# Patient Record
Sex: Female | Born: 1995 | Race: Black or African American | Hispanic: No | Marital: Single | Smoking: Never smoker
Health system: Southern US, Community
[De-identification: ages and names within clinical notes are randomized; demographics above are authoritative.]

## PROBLEM LIST (undated history)

## (undated) DIAGNOSIS — J45909 Unspecified asthma, uncomplicated: Secondary | ICD-10-CM

---

## 2017-09-05 ENCOUNTER — Emergency Department (HOSPITAL_COMMUNITY)
Admission: EM | Admit: 2017-09-05 | Discharge: 2017-09-06 | Disposition: A | Discharge status: Home / Self Care | Payer: BLUE CROSS/BLUE SHIELD | Attending: Emergency Medicine | Admitting: Emergency Medicine

## 2017-09-05 ENCOUNTER — Other Ambulatory Visit: Payer: Self-pay

## 2017-09-05 ENCOUNTER — Encounter (HOSPITAL_COMMUNITY): Payer: Self-pay | Admitting: Emergency Medicine

## 2017-09-05 ENCOUNTER — Emergency Department (HOSPITAL_COMMUNITY): Payer: BLUE CROSS/BLUE SHIELD

## 2017-09-05 DIAGNOSIS — M25562 Pain in left knee: Secondary | ICD-10-CM | POA: Insufficient documentation

## 2017-09-05 DIAGNOSIS — Z5321 Procedure and treatment not carried out due to patient leaving prior to being seen by health care provider: Secondary | ICD-10-CM | POA: Insufficient documentation

## 2017-09-05 NOTE — ED Notes (Signed)
Pt called for triage x3 by Holley(RN) no response.

## 2017-09-05 NOTE — ED Triage Notes (Addendum)
Pt states yesterday she was lying down when her left knee cap popped out of place, she states it was on the side of her leg. EMS came out and put a knee sleeve on it and it popped back into place. Pt states she feels a knot to top of knee now with pain. Pedal pulses good. No discoloration to leg. *Pt also states she wants to get checked for PID while here, states she has implant brith control and she has frequent periods.

## 2017-09-05 NOTE — ED Notes (Signed)
When patient was called back Friends of patient had left.

## 2017-09-18 ENCOUNTER — Emergency Department (HOSPITAL_COMMUNITY)
Admission: EM | Admit: 2017-09-18 | Discharge: 2017-09-18 | Discharge status: Against Medical Advice | Payer: Medicaid Other

## 2017-09-18 ENCOUNTER — Other Ambulatory Visit: Payer: Self-pay

## 2017-09-18 NOTE — ED Notes (Signed)
No answer for triage.

## 2017-09-18 NOTE — ED Notes (Signed)
No answer when called 

## 2017-09-30 ENCOUNTER — Ambulatory Visit (INDEPENDENT_AMBULATORY_CARE_PROVIDER_SITE_OTHER): Payer: BLUE CROSS/BLUE SHIELD

## 2017-09-30 ENCOUNTER — Ambulatory Visit (HOSPITAL_COMMUNITY)
Admission: EM | Admit: 2017-09-30 | Discharge: 2017-09-30 | Disposition: A | Discharge status: Home / Self Care | Payer: BLUE CROSS/BLUE SHIELD | Attending: Family Medicine | Admitting: Family Medicine

## 2017-09-30 ENCOUNTER — Encounter (HOSPITAL_COMMUNITY): Payer: Self-pay | Admitting: Emergency Medicine

## 2017-09-30 DIAGNOSIS — M940 Chondrocostal junction syndrome [Tietze]: Secondary | ICD-10-CM

## 2017-09-30 DIAGNOSIS — S83002A Unspecified subluxation of left patella, initial encounter: Secondary | ICD-10-CM

## 2017-09-30 HISTORY — DX: Unspecified asthma, uncomplicated: J45.909

## 2017-09-30 MED ORDER — IBUPROFEN 800 MG PO TABS: 800.0000 mg | ORAL_TABLET | Freq: Three times a day (TID) | ORAL | 0 refills | Status: DC | PRN

## 2017-09-30 NOTE — Discharge Instructions (Addendum)
Wear knee brace Walking exercise to strengthen your quadricep muscle Take the ibuprofen 3 times a day with food This is for pain and inflammation This should help with the rib pain Work note is given Return if not better in several days
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
## 2017-09-30 NOTE — ED Triage Notes (Signed)
PT fell on left side last night and has left rib pain. PT reports left knee "popped out" 2 weeks ago and still has pain.

## 2018-01-08 ENCOUNTER — Other Ambulatory Visit: Payer: Self-pay

## 2018-01-08 ENCOUNTER — Encounter (HOSPITAL_COMMUNITY): Payer: Self-pay | Admitting: *Deleted

## 2018-01-08 ENCOUNTER — Ambulatory Visit (HOSPITAL_COMMUNITY)
Admission: EM | Admit: 2018-01-08 | Discharge: 2018-01-08 | Disposition: A | Discharge status: Home / Self Care | Payer: BLUE CROSS/BLUE SHIELD | Attending: Family Medicine | Admitting: Family Medicine

## 2018-01-08 DIAGNOSIS — M545 Low back pain, unspecified: Secondary | ICD-10-CM

## 2018-01-08 DIAGNOSIS — M62830 Muscle spasm of back: Secondary | ICD-10-CM

## 2018-01-08 DIAGNOSIS — M62838 Other muscle spasm: Secondary | ICD-10-CM

## 2018-01-08 MED ORDER — CYCLOBENZAPRINE HCL 10 MG PO TABS: 10.0000 mg | ORAL_TABLET | Freq: Two times a day (BID) | ORAL | 0 refills | Status: DC | PRN

## 2018-01-08 MED ORDER — NAPROXEN 375 MG PO TABS: 375.0000 mg | ORAL_TABLET | Freq: Two times a day (BID) | ORAL | 0 refills | Status: AC
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
## 2019-06-29 ENCOUNTER — Encounter (HOSPITAL_COMMUNITY): Payer: Self-pay

## 2019-06-29 ENCOUNTER — Other Ambulatory Visit: Payer: Self-pay

## 2019-06-29 ENCOUNTER — Ambulatory Visit (HOSPITAL_COMMUNITY)
Admission: EM | Admit: 2019-06-29 | Discharge: 2019-06-29 | Disposition: A | Discharge status: Home / Self Care | Payer: PRIVATE HEALTH INSURANCE | Attending: Family Medicine | Admitting: Family Medicine

## 2019-06-29 DIAGNOSIS — S300XXA Contusion of lower back and pelvis, initial encounter: Secondary | ICD-10-CM

## 2019-06-29 MED ORDER — TRAMADOL HCL 50 MG PO TABS: 50.0000 mg | ORAL_TABLET | Freq: Four times a day (QID) | ORAL | 0 refills | Status: AC | PRN

## 2019-06-29 NOTE — ED Triage Notes (Signed)
Patient presents to Urgent Care with complaints of lower back and butt pain since slipping on ice 5 days ago. Patient reports the pain has progressed, pt has been applying ice and taking nsaids, would like a note to remain out of work today.

## 2019-06-29 NOTE — ED Provider Notes (Signed)
[facility]   [id number] 06/29/19 Arrival Time: 2774  ASSESSMENT & PLAN:  1. Coccyx contusion, initial encounter     Able to ambulate here and hemodynamically stable. Continue Tylenol/Advil. Activities as tolerated. Expect slow improvement.  Meds ordered this encounter  Medications  . traMADol (ULTRAM) 50 MG tablet    Sig: Take 1 tablet (50 mg total) by mouth every 6 (six) hours as needed.    Dispense:  12 tablet    Refill:  0    Medication sedation precautions given. Encourage ROM/movement as tolerated.  Recommend: Follow-up Information    [facility].   Why: If worsening or failing to improve as anticipated. Contact information: [address] [place] [phone]          Reviewed expectations re: course of current medical issues. Questions answered. Outlined signs and symptoms indicating need for more acute intervention. Patient verbalized understanding. After Visit Summary given.   SUBJECTIVE: History from: patient.  Rachel Robles is a 24 y.o. female who presents with complaint of non-radiating persistent tailbone pain. Reports falling onto buttock approx 4 d ago. Immediate pain that has been continuing; dull ache over tailbone. History of back problems requiring medical care: none reported.  Aggravating factors: certain movements, prolonged walking/standing and prolonged sitting. Alleviating factors: have not been identified. Progressive LE weakness or saddle anesthesia: none. Extremity sensation changes or weakness: none. Ambulatory without difficulty. Normal bowel/bladder habits: yes; without urinary retention. Normal PO intake without n/v. No associated abdominal pain/n/v. Self treatment: has OTC analgesics with little relief.  Reports no chronic steroid use, fevers, IV drug use, or recent back surgeries or procedures.    OBJECTIVE:  Vitals:   06/29/19 1640  BP: 117/74    Pulse: 79  Resp: 15  Temp: 98.4 F (36.9 C)  TempSrc: Oral  SpO2: 99%    General appearance: alert; no distress HEENT: [place]; AT Neck: supple with FROM; without midline tenderness CV: RRR Lungs: unlabored respirations; symmetrical air entry Abdomen: soft, non-tender; non-distended Back: moderate and poorly localized tenderness to palpation over tailbone; FROM at waist; bruising: none; without lumbar midline tenderness Extremities: without edema; symmetrical without gross deformities; normal ROM of bilateral LE Skin: warm and dry Neurologic: normal gait but slow; no neurological abnormalities of LEs. Psychological: alert and cooperative; normal mood and affect    No Known Allergies  Past Medical History:  Diagnosis Date  . Asthma    Social History   Socioeconomic History  . Marital status: Single    Spouse name: Not on file  . Number of children: Not on file  . Years of education: Not on file  . Highest education level: Not on file  Occupational History  . Not on file  Tobacco Use  . Smoking status: Never Smoker  . Smokeless tobacco: Never Used  Substance and Sexual Activity  . Alcohol use: Yes    Comment: occ  . Drug use: Not on file  . Sexual activity: Not on file  Other Topics Concern  . Not on file  Social History Narrative  . Not on file   Social Determinants of Health   Financial Resource Strain:   . Difficulty of Paying Living Expenses: Not on file  Food Insecurity:   . Worried About Charity fundraiser in the Last Year: Not on file  . Ran Out of Food in the Last Year: Not on file  Transportation Needs:   . Lack of Transportation (  Medical): Not on file  . Lack of Transportation (Non-Medical): Not on file  Physical Activity:   . Days of Exercise per Week: Not on file  . Minutes of Exercise per Session: Not on file  Stress:   . Feeling of Stress : Not on file  Social Connections:   . Frequency of Communication with Friends and Family: Not on file   . Frequency of Social Gatherings with Friends and Family: Not on file  . Attends Religious Services: Not on file  . Active Member of Clubs or Organizations: Not on file  . Attends Banker Meetings: Not on file  . Marital Status: Not on file  Intimate Partner Violence:   . Fear of Current or Ex-Partner: Not on file  . Emotionally Abused: Not on file  . Physically Abused: Not on file  . Sexually Abused: Not on file   Family History  Problem Relation Age of Onset  . Healthy Mother   . Asthma Father    History reviewed. No pertinent surgical history.   Mardella Layman, MD 06/29/19 [phone]

## 2019-06-29 NOTE — Discharge Instructions (Addendum)
Be aware, you have been prescribed pain medications that may cause drowsiness. Do not combine with alcohol or other illicit drugs. Please do not drive, operate heavy machinery, or take part in activities that require making important decisions while on this medication as your judgement may be clouded.  

## 2019-06-30 ENCOUNTER — Telehealth (HOSPITAL_COMMUNITY): Payer: Self-pay

## 2020-03-28 IMAGING — CR DG KNEE COMPLETE 4+V*L*
4 series · 4 of 4 positions shown · non-contrast
Comparison: None.

CLINICAL DATA: Lateral left knee pain and popping.

EXAM:
LEFT KNEE - COMPLETE 4+ VIEW

[knee ap]
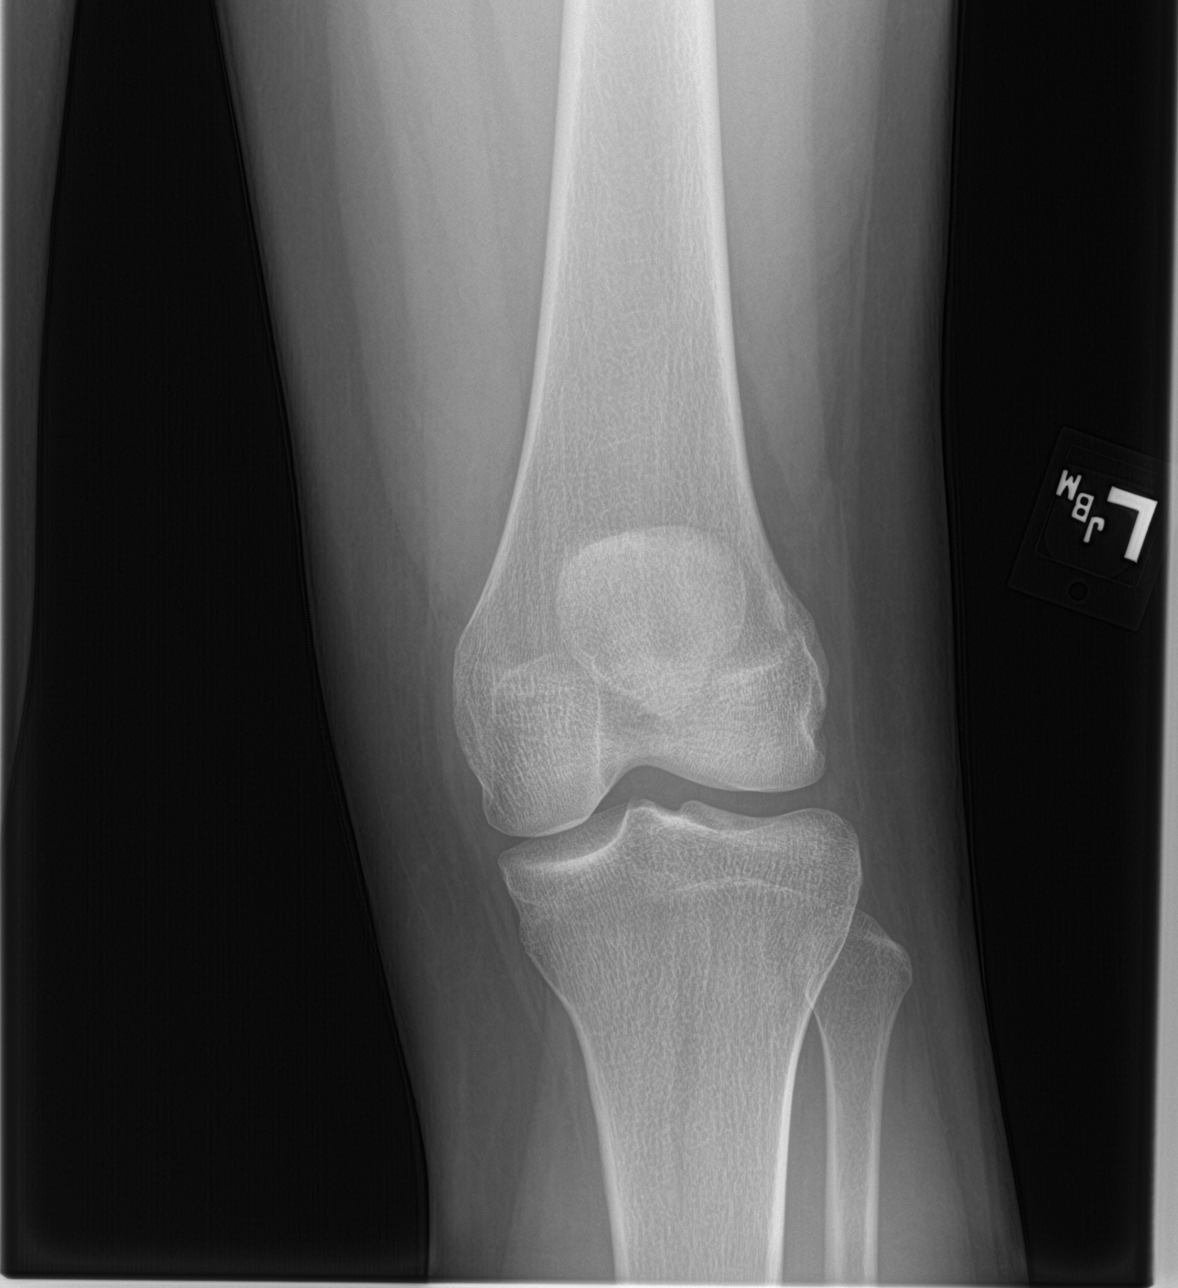

[knee lat]
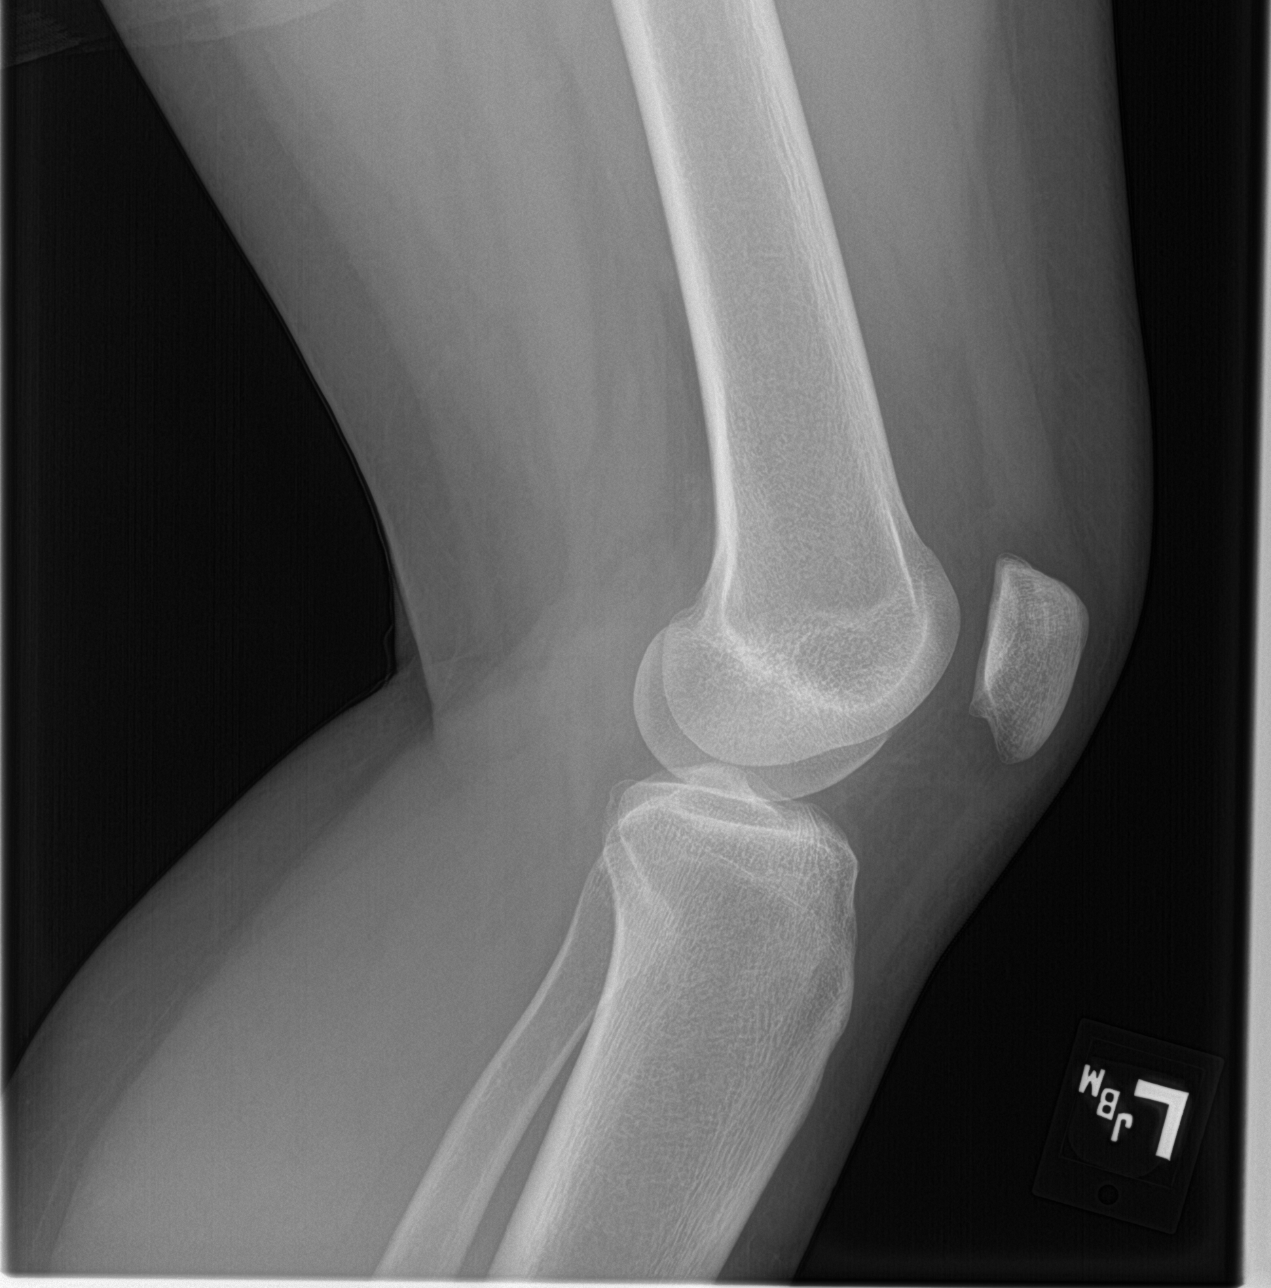

[knee obl (1 of 2)]
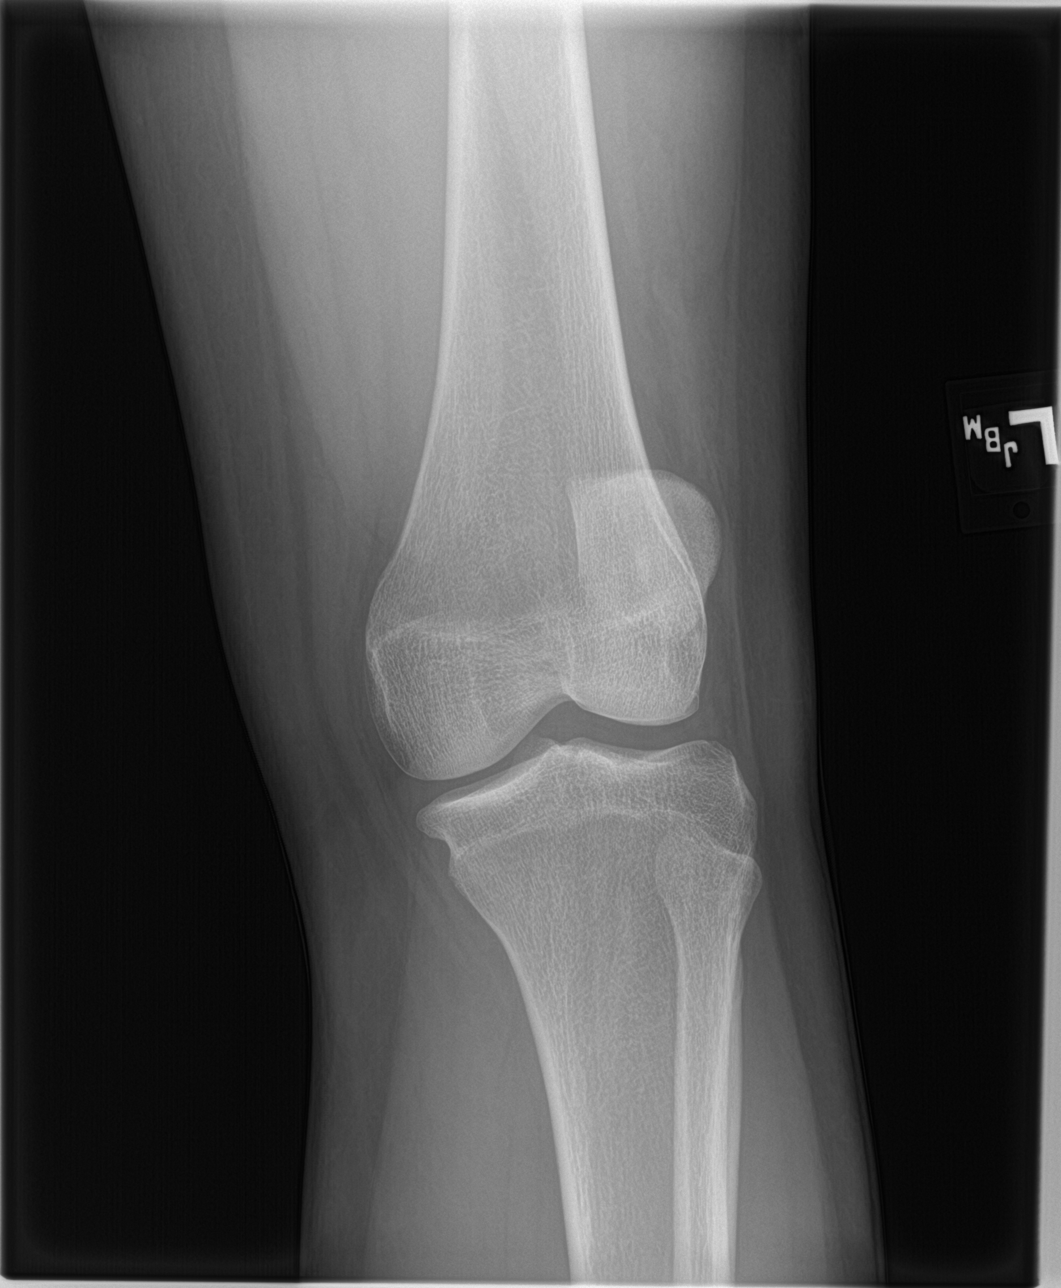

[knee obl (2 of 2)]
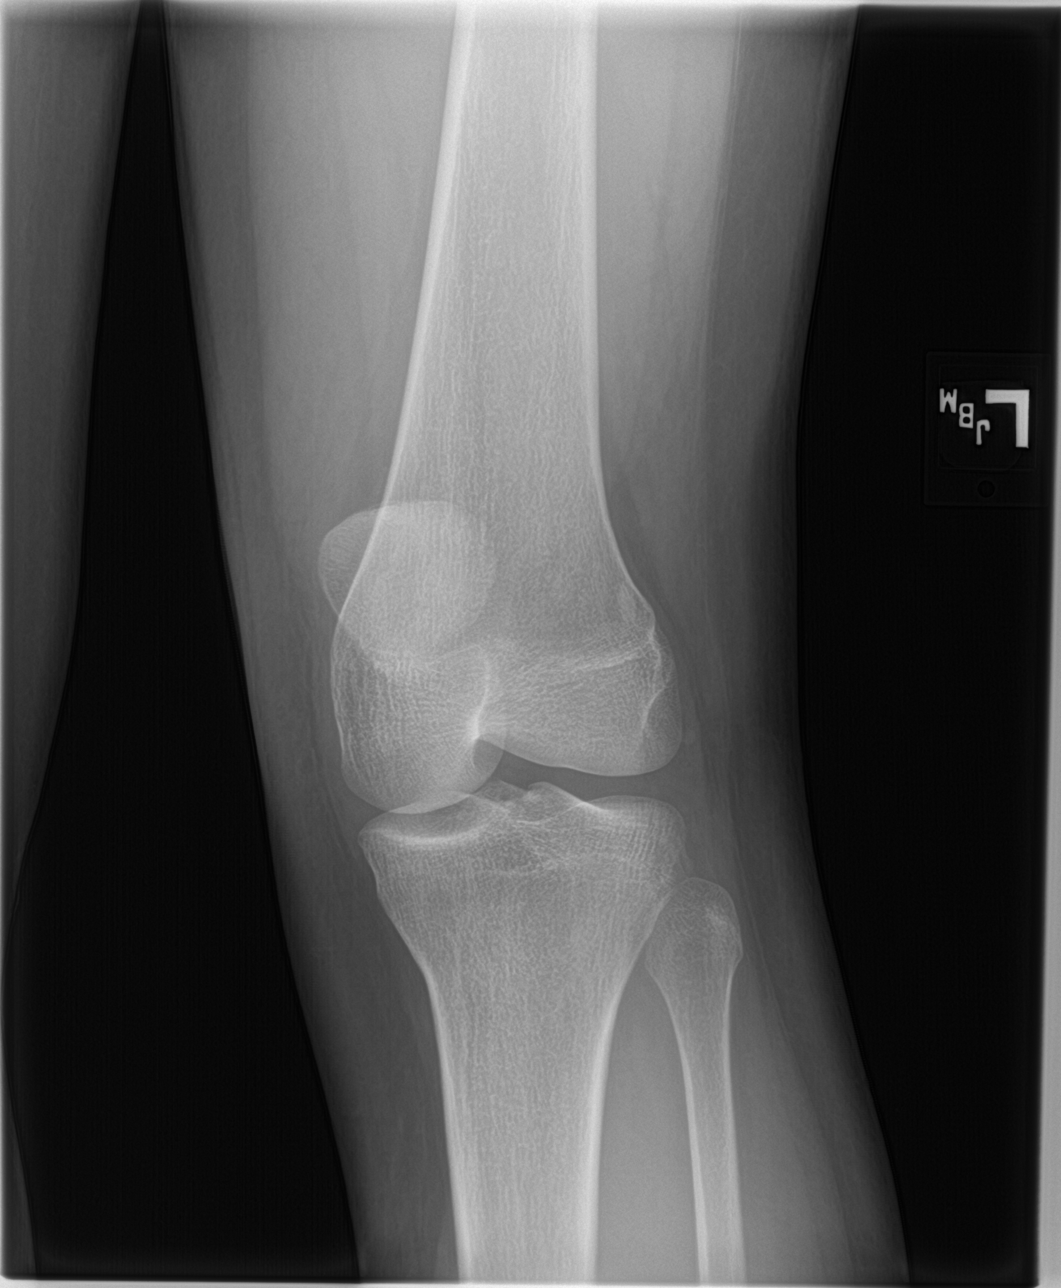

[4 of 4 positions shown; findings below may reference images not displayed]

FINDINGS: No evidence of fracture, dislocation, or joint effusion. No evidence
of arthropathy or other focal bone abnormality. Soft tissues are
unremarkable.
IMPRESSION: Negative.
